# Patient Record
Sex: Male | Born: 1995 | Race: White | Hispanic: No | Marital: Married | Smoking: Never smoker
Health system: Southern US, Community
[De-identification: ages and names within clinical notes are randomized; demographics above are authoritative.]

## PROBLEM LIST (undated history)

## (undated) HISTORY — PX: NO PAST SURGERIES: SHX2092

---

## 2013-01-24 ENCOUNTER — Ambulatory Visit (INDEPENDENT_AMBULATORY_CARE_PROVIDER_SITE_OTHER): Payer: BC Managed Care – PPO | Admitting: Family Medicine

## 2013-01-24 ENCOUNTER — Encounter: Payer: Self-pay | Admitting: Family Medicine

## 2013-01-24 VITALS — BP 110/72 | HR 60 | Temp 99.0°F | Resp 18 | Ht 73.0 in | Wt 226.0 lb

## 2013-01-24 DIAGNOSIS — Z23 Encounter for immunization: Secondary | ICD-10-CM

## 2013-01-24 DIAGNOSIS — M545 Low back pain: Secondary | ICD-10-CM

## 2013-01-24 MED ORDER — CYCLOBENZAPRINE HCL 10 MG PO TABS: 10.0000 mg | ORAL_TABLET | Freq: Three times a day (TID) | ORAL | Status: DC | PRN

## 2013-01-24 MED ORDER — MELOXICAM 15 MG PO TABS
15.0000 mg | ORAL_TABLET | Freq: Every day | ORAL | Status: DC
Start: 2013-01-24 — End: 2014-04-03

## 2013-01-24 NOTE — Progress Notes (Signed)
Subjective:    Patient ID: Hector Gray, male    DOB: Jul 23, 1995, 17 y.o.   MRN: [id number]  HPI Patient reports one week of knee at the lower back pain on the left side. He denies any injury to his back. He states it started with a mild pain in his left side slightly below his shoulder blade. It has gradually worsened to become a moderate constant pain that is worse with movement. He denies any pleurisy. He denies any cough. He denies any hemoptysis. He denies any fever. He denies any shortness of breath. He denies any abdominal pain. He denies any nausea vomiting. He denies any hematuria or dysuria. The pain does not radiate and is made worse by movement. He denies any symptoms of cauda equina syndrome or sciatica. He has no bony tenderness in the center of his back. He does have a palpable muscle spasm in his lower back on the left side. Pain is worsened with prolonged standing or working.  It is better with ibuprofen. No past medical history on file. No current outpatient prescriptions on file prior to visit.   No current facility-administered medications on file prior to visit.   No Known Allergies History   Social History  . Marital Status: Single    Spouse Name: N/A    Number of Children: N/A  . Years of Education: N/A   Occupational History  . Not on file.   Social History Main Topics  . Smoking status: Never Smoker   . Smokeless tobacco: Not on file  . Alcohol Use: No  . Drug Use: No  . Sexual Activity: Not on file   Other Topics Concern  . Not on file   Social History Narrative  . No narrative on file      Review of Systems  All other systems reviewed and are negative.       Objective:   Physical Exam  Vitals reviewed. Constitutional: He is oriented to person, place, and time.  Cardiovascular: Normal rate, regular rhythm and normal heart sounds.   No murmur heard. Pulmonary/Chest: Effort normal and breath sounds normal. No respiratory distress. He has  no wheezes. He has no rales.  Abdominal: Soft. Bowel sounds are normal. He exhibits no distension. There is no tenderness. There is no rebound.  Musculoskeletal:       Thoracic back: He exhibits pain. He exhibits normal range of motion, no tenderness, no bony tenderness and no spasm.       Lumbar back: He exhibits pain and spasm. He exhibits normal range of motion, no tenderness and no bony tenderness.  Neurological: He is alert and oriented to person, place, and time. He has normal reflexes. He displays normal reflexes. No cranial nerve deficit. He exhibits normal muscle tone. Coordination normal.   patient has a negative straight leg raise bilaterally. He has normal reflexes at the knee and ankle. He has normal muscle strength in the legs.        Assessment & Plan:  1. Low back pain Begin Mobic 15 mg by mouth daily. Use Flexeril 10 mg every 8 hours as needed for muscle spasms. I recommended he use the Flexeril only at night ago sleep. I recommended compresses to the area twice a day. If symptoms are not improving in one week I would recommend a T-spine x-ray as well as an L-spine x-ray. Obviously if symptoms change he is to notify me immediately particularly if he develops any pain with urination or hematuria or the  pain begins to radiate. - meloxicam (MOBIC) 15 MG tablet; Take 1 tablet (15 mg total) by mouth daily.  Dispense: 30 tablet; Refill: 0 - cyclobenzaprine (FLEXERIL) 10 MG tablet; Take 1 tablet (10 mg total) by mouth 3 (three) times daily as needed for muscle spasms.  Dispense: 30 tablet; Refill: 0

## 2013-01-27 ENCOUNTER — Other Ambulatory Visit: Payer: Self-pay | Admitting: Family Medicine

## 2013-01-27 ENCOUNTER — Telehealth: Payer: Self-pay | Admitting: Family Medicine

## 2013-01-27 ENCOUNTER — Ambulatory Visit
Admission: RE | Admit: 2013-01-27 | Discharge: 2013-01-27 | Disposition: A | Discharge status: Home / Self Care | Payer: BC Managed Care – PPO | Source: Ambulatory Visit | Attending: Family Medicine | Admitting: Family Medicine

## 2013-01-27 DIAGNOSIS — M549 Dorsalgia, unspecified: Secondary | ICD-10-CM

## 2013-01-27 MED ORDER — TRAMADOL HCL 50 MG PO TABS: 50.0000 mg | ORAL_TABLET | Freq: Four times a day (QID) | ORAL | Status: DC | PRN

## 2013-01-27 NOTE — Telephone Encounter (Signed)
I gave him mobic 1 a day, and flexeril as needed.  Do not take mobic and motrin together.  Can use tylenol.  Go get x-ray and I will order.

## 2013-01-27 NOTE — Telephone Encounter (Signed)
Patient is still hurting in his back . Cyclobenzaprine  Is it all right to take tylenol or IBU  with this medicine in between doses ?

## 2013-01-27 NOTE — Telephone Encounter (Signed)
Pts mother aware and will take him to get xray

## 2013-01-27 NOTE — Telephone Encounter (Signed)
Pt is in a lot of pain and laying in the floor with heating pad on back. Is there anything stronger he could take?  Per [facility] Can take ultram 50mg  q 6 hours and Flexeril. Continue Mobic. - Pts mother aware and med called to pharm.

## 2013-01-31 ENCOUNTER — Ambulatory Visit: Payer: BC Managed Care – PPO | Admitting: Family Medicine

## 2013-04-07 ENCOUNTER — Ambulatory Visit: Payer: BC Managed Care – PPO | Admitting: Family Medicine

## 2014-04-03 ENCOUNTER — Encounter: Payer: Self-pay | Admitting: Family Medicine

## 2014-04-03 ENCOUNTER — Ambulatory Visit (INDEPENDENT_AMBULATORY_CARE_PROVIDER_SITE_OTHER): Payer: BC Managed Care – PPO | Admitting: Family Medicine

## 2014-04-03 VITALS — BP 128/80 | HR 68 | Temp 98.0°F | Resp 18 | Wt 212.0 lb

## 2014-04-03 DIAGNOSIS — R3915 Urgency of urination: Secondary | ICD-10-CM

## 2014-04-03 DIAGNOSIS — N342 Other urethritis: Secondary | ICD-10-CM

## 2014-04-03 LAB — URINALYSIS, ROUTINE W REFLEX MICROSCOPIC
BILIRUBIN URINE: NEGATIVE
Glucose, UA: NEGATIVE mg/dL
Hgb urine dipstick: NEGATIVE
Ketones, ur: NEGATIVE mg/dL
Leukocytes, UA: NEGATIVE
Nitrite: NEGATIVE
PH: 6.5 (ref 5.0–8.0)
Protein, ur: NEGATIVE mg/dL
SPECIFIC GRAVITY, URINE: 1.015 (ref 1.005–1.030)
UROBILINOGEN UA: 0.2 mg/dL (ref 0.0–1.0)

## 2014-04-03 MED ORDER — CIPROFLOXACIN HCL 500 MG PO TABS: 500.0000 mg | ORAL_TABLET | Freq: Two times a day (BID) | ORAL | Status: DC

## 2014-04-03 NOTE — Addendum Note (Signed)
Addended by: Calvyn Kurtzman M on: 04/03/2014 10:39 AM   Modules accepted: Orders  

## 2014-04-03 NOTE — Progress Notes (Signed)
   Subjective:    Patient ID: Hector Gray, male    DOB: 1995-07-24, 18 y.o.   MRN: [id number]  HPI Patient is a 24 hours of urinary urgency and frequency. He states that he constantly feels like he has to go the restroom. When he goes to the restroom he will only a trace amount. As soon as he begins to stand up, he feels like he has to urinate again. He has some mild dysuria. He denies any hematuria. He denies any exposure to gonorrhea or chlamydia. Urinalysis today is completely normal. There is no evidence of a penile rash or herpes. No past medical history on file. No past surgical history on file. No current outpatient prescriptions on file prior to visit.   No current facility-administered medications on file prior to visit.   No Known Allergies History   Social History  . Marital Status: Single    Spouse Name: N/A    Number of Children: N/A  . Years of Education: N/A   Occupational History  . Not on file.   Social History Main Topics  . Smoking status: Never Smoker   . Smokeless tobacco: Not on file  . Alcohol Use: No  . Drug Use: No  . Sexual Activity: Not on file   Other Topics Concern  . Not on file   Social History Narrative      Review of Systems  All other systems reviewed and are negative.      Objective:   Physical Exam  Cardiovascular: Normal rate and regular rhythm.   Pulmonary/Chest: Effort normal and breath sounds normal.  Genitourinary: Penis normal.  Vitals reviewed.         Assessment & Plan:  Urgency of urination - Plan: Urinalysis, Routine w reflex microscopic  Urethritis - Plan: ciprofloxacin (CIPRO) 500 MG tablet  Patient symptoms sound like urethritis. There may be a mild bacterial infection in the urethra causing his symptoms. I will screen the patient for gonorrhea and chlamydia. I will treat the patient empirically with Cipro 500 mg by mouth twice a day and await the results of his urine culture as well as GC chlamydia  screen

## 2014-04-04 LAB — URINE CULTURE
Colony Count: NO GROWTH
Organism ID, Bacteria: NO GROWTH

## 2014-04-04 LAB — GC/CHLAMYDIA PROBE AMP, URINE
CHLAMYDIA, SWAB/URINE, PCR: NEGATIVE
GC PROBE AMP, URINE: NEGATIVE

## 2014-07-27 ENCOUNTER — Ambulatory Visit (INDEPENDENT_AMBULATORY_CARE_PROVIDER_SITE_OTHER): Payer: BLUE CROSS/BLUE SHIELD | Admitting: Family Medicine

## 2014-07-27 ENCOUNTER — Encounter: Payer: Self-pay | Admitting: Family Medicine

## 2014-07-27 VITALS — BP 118/60 | HR 64 | Temp 98.5°F | Resp 12 | Ht 73.0 in | Wt 208.0 lb

## 2014-07-27 DIAGNOSIS — J0100 Acute maxillary sinusitis, unspecified: Secondary | ICD-10-CM

## 2014-07-27 MED ORDER — AMOXICILLIN 875 MG PO TABS: 875.0000 mg | ORAL_TABLET | Freq: Two times a day (BID) | ORAL | Status: DC

## 2014-07-27 NOTE — Progress Notes (Signed)
Patient ID: Hector Gray, male   DOB: 12-Oct-1995, 19 y.o.   MRN: [id number]   Subjective:    Patient ID: Hector Galloddison Packard, male    DOB: 12-Oct-1995, 19 y.o.   MRN: [id number]  Patient presents for Illness  patient here with sinus pressure and drainage worsening of the past 3 weeks. He states he does get some mild allergies but usually that clears up within a couple days with the season changes. He's had headache and low-grade fever for the past couple days as well. He has not had any cough no body aches otherwise. He has taken some over-the-counter cough and sinus medicine with minimal improvement    Review Of Systems:  GEN- denies fatigue, fever, weight loss,weakness, recent illness HEENT- denies eye drainage, change in vision, +nasal discharge, CVS- denies chest pain, palpitations RESP- denies SOB, cough, wheeze ABD- denies N/V, change in stools, abd pain GU- denies dysuria, hematuria, dribbling, incontinence MSK- denies joint pain, muscle aches, injury Neuro- + headache, dizziness, syncope, seizure activity       Objective:    BP 118/60 mmHg  Pulse 64  Temp(Src) 98.5 F (36.9 C) (Oral)  Resp 12  Ht 6\' 1"  (1.854 m)  Wt 208 lb (94.348 kg)  BMI 27.45 kg/m2 GEN- NAD, alert and oriented x3 HEENT- PERRL, EOMI, non injected sclera, pink conjunctiva, MMM, oropharynx mild injection, TM clear bilat no effusion,  + maxillary sinus tenderness, inflammed turbinates,  +Nasal drainage  Neck- Supple, shotty ant  LAD CVS- RRR, no murmur RESP-CTAB EXT- No edema Pulses- Radial 2+         Assessment & Plan:      Problem List Items Addressed This Visit    None    Visit Diagnoses    Acute maxillary sinusitis, recurrence not specified    -  Primary    Amox x 10 days, sudafed, nasal saline    Relevant Medications    amoxicillin (AMOXIL) tablet       Note: This dictation was prepared with Dragon dictation along with smaller phrase technology. Any transcriptional errors that  result from this process are unintentional.

## 2014-07-27 NOTE — Patient Instructions (Signed)
Take antibiotics as prescribed Use nasal saline Sudafed  F/U as needed

## 2014-09-25 IMAGING — CR DG LUMBAR SPINE COMPLETE 4+V
5 series · 5 of 5 positions shown · non-contrast
Comparison: Thoracic series from the same day reported separately.

CLINICAL DATA: 16-year-old with pain. No known injury.

EXAM:
LUMBAR SPINE - COMPLETE 4+ VIEW

[t l-spine a.p.]
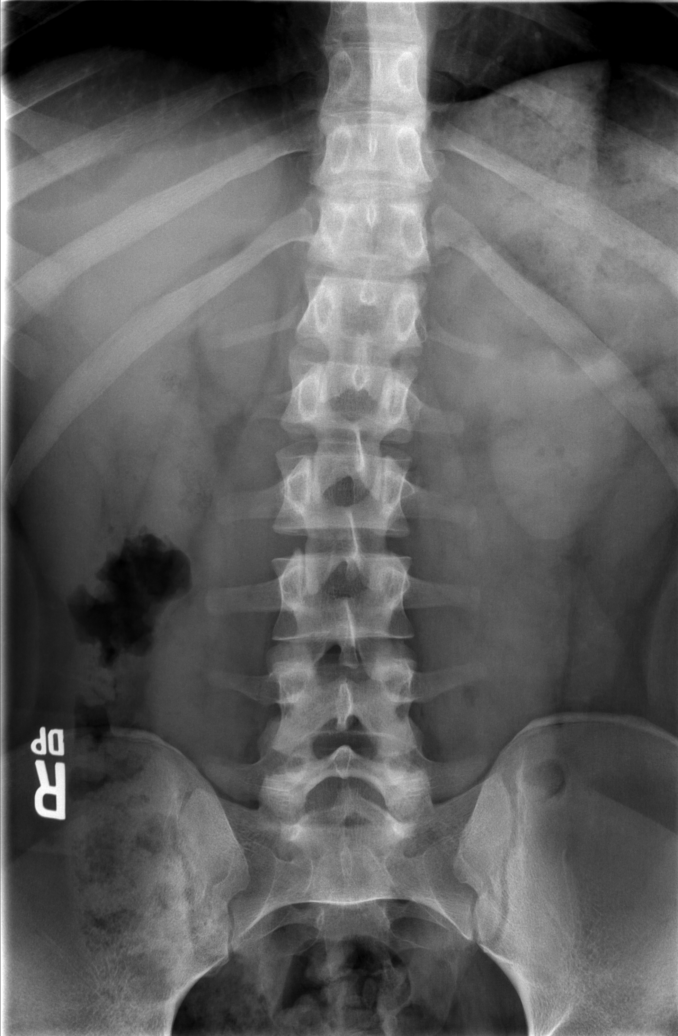

[t l-spine oblique exposure (1 of 2)]
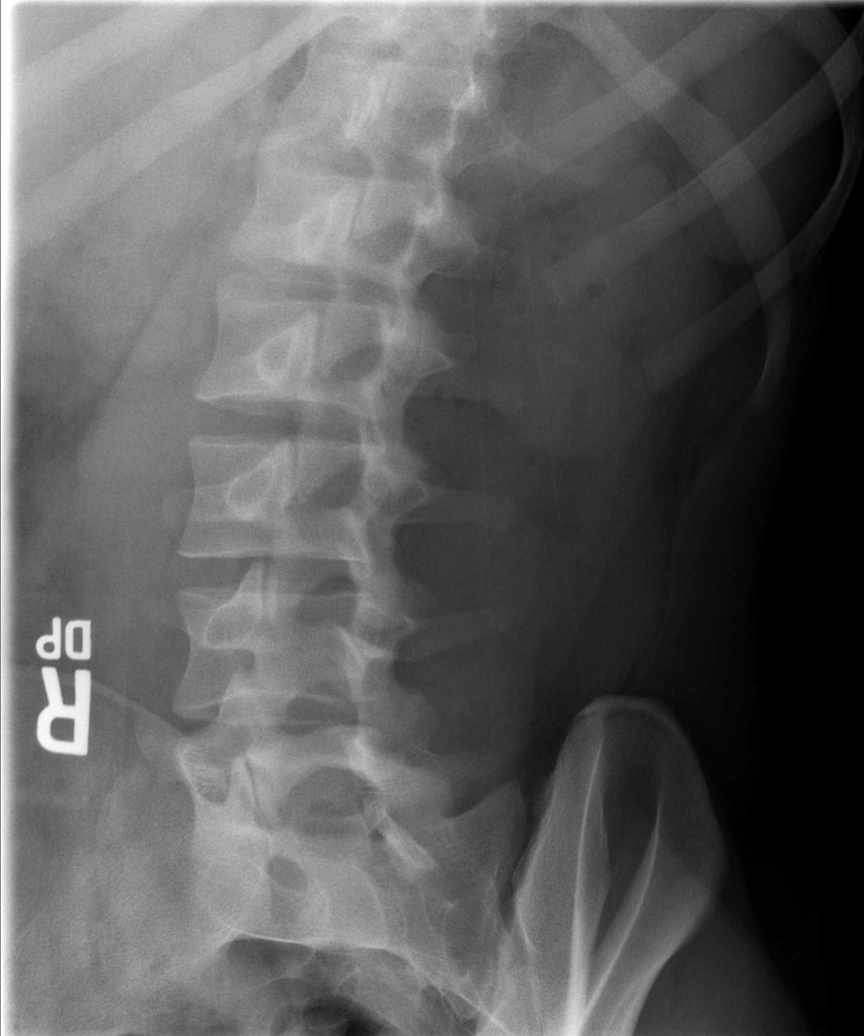

[t l-spine oblique exposure (2 of 2)]
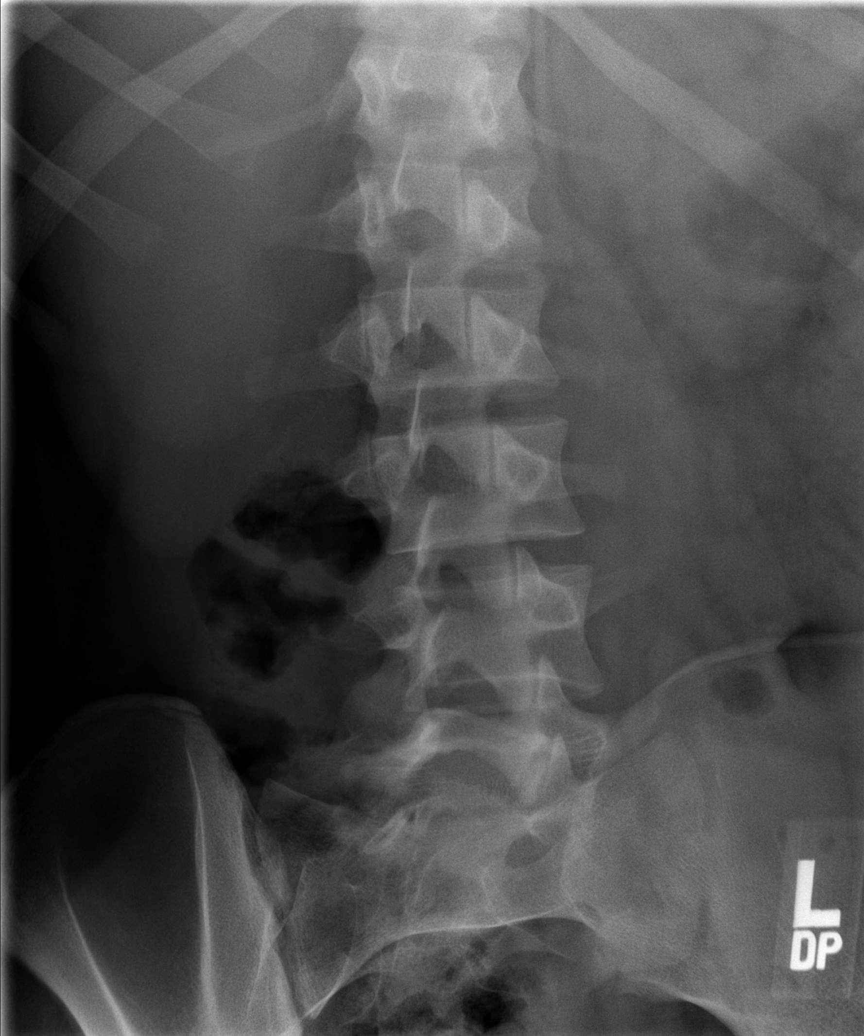

[t l-spine lat]
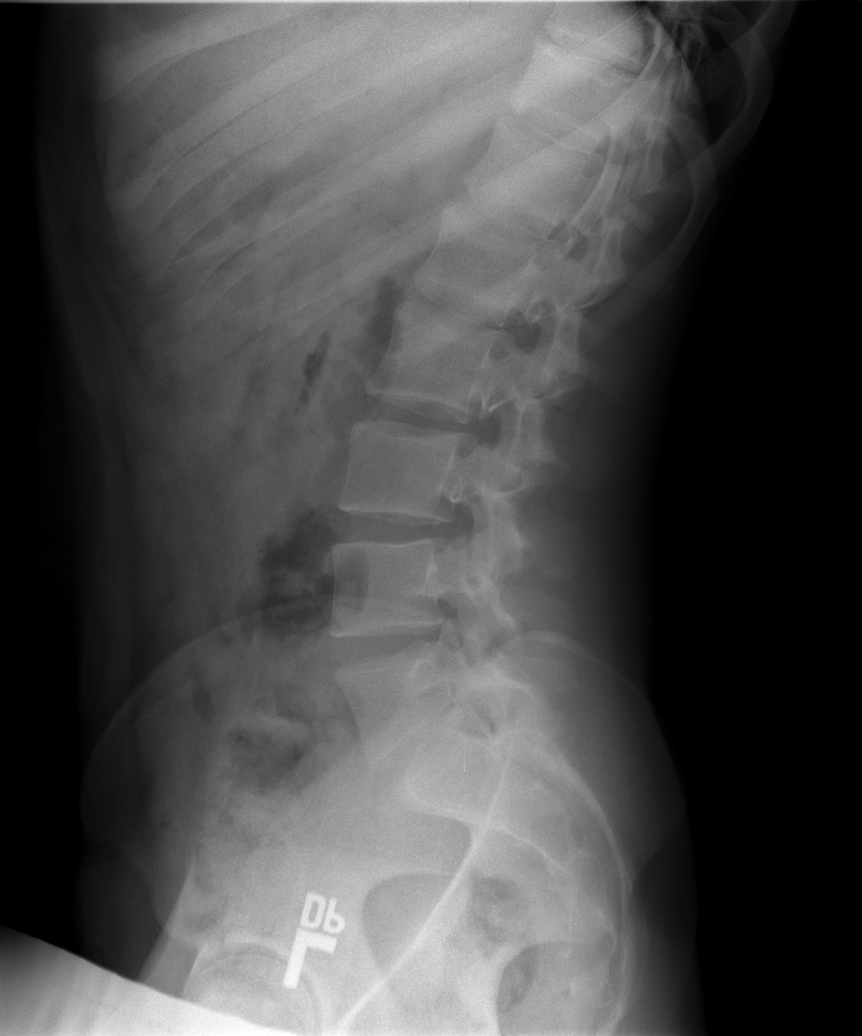

[t l-spine l5-s1 spot]
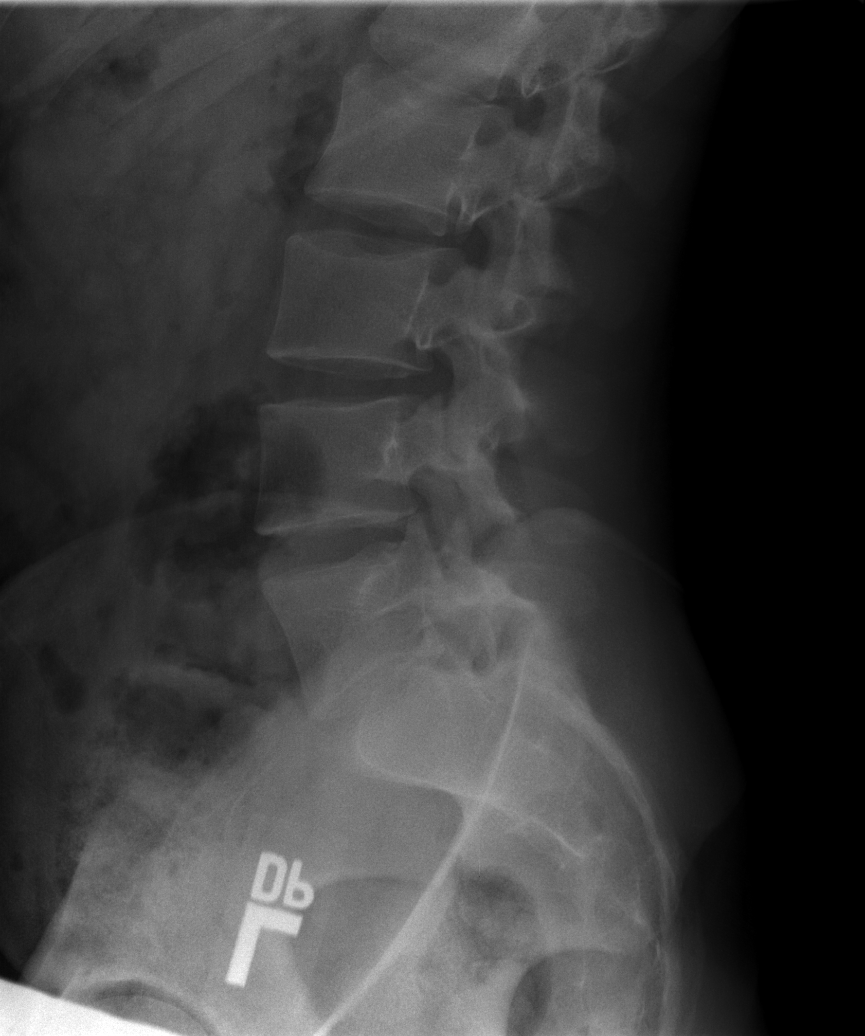

[5 of 5 positions shown; findings below may reference images not displayed]

FINDINGS: The patient is nearing skeletal maturity. Normal lumbar
segmentation. Subtle lower thoracic spinal curvature again noted.
Lumbar vertebral height and alignment within normal limits. Normal
disc spaces. No pars fracture. sacral ala and SI joints within
normal limits.
IMPRESSION: Negative radiographic appearance of the lumbar spine.

## 2015-08-29 ENCOUNTER — Encounter: Payer: Self-pay | Admitting: Family Medicine

## 2015-08-29 ENCOUNTER — Ambulatory Visit (INDEPENDENT_AMBULATORY_CARE_PROVIDER_SITE_OTHER): Payer: BLUE CROSS/BLUE SHIELD | Admitting: Family Medicine

## 2015-08-29 VITALS — BP 126/80 | HR 56 | Temp 98.8°F | Resp 12 | Ht 73.0 in | Wt 220.0 lb

## 2015-08-29 DIAGNOSIS — H6981 Other specified disorders of Eustachian tube, right ear: Secondary | ICD-10-CM | POA: Diagnosis not present

## 2015-08-29 MED ORDER — FLUTICASONE PROPIONATE 50 MCG/ACT NA SUSP: 2.0000 | Freq: Every day | NASAL | Status: DC

## 2015-08-29 MED ORDER — CETIRIZINE HCL 10 MG PO TABS: 10.0000 mg | ORAL_TABLET | Freq: Every day | ORAL | Status: DC

## 2015-08-29 NOTE — Progress Notes (Signed)
   Subjective:    Patient ID: Hector Gray, male    DOB: [birth year], 20 y.o.   MRN: [id number]  HPI  He reports a one-week history of right ear being stopped up. Has been battling allergies recently. Patient states he feels like his ear is full of water. He also reports a dull pressure. He reports decreased hearing and some mild tinnitus. On examination today, the right tympanic membrane shows a clear serous effusion. There are some mild erythema. There is no obstruction of the external auditory canal. He has prominently swollen nasal mucosa and turbinates. He is extremely congested with rhinorrhea. He denies any fevers or chills or sinus pain No past medical history on file. No past surgical history on file. No current outpatient prescriptions on file prior to visit.   No current facility-administered medications on file prior to visit.   No Known Allergies Social History   Social History  . Marital Status: Single    Spouse Name: N/A  . Number of Children: N/A  . Years of Education: N/A   Occupational History  . Not on file.   Social History Main Topics  . Smoking status: Never Smoker   . Smokeless tobacco: Not on file  . Alcohol Use: No  . Drug Use: No  . Sexual Activity: Not on file   Other Topics Concern  . Not on file   Social History Narrative     Review of Systems  All other systems reviewed and are negative.      Objective:   Physical Exam  Constitutional: He appears well-developed and well-nourished. No distress.  HENT:  Right Ear: No tenderness. Tympanic membrane is injected and bulging. Tympanic membrane is not scarred, not perforated, not erythematous and not retracted. Tympanic membrane mobility is abnormal. A middle ear effusion is present.  Left Ear: External ear normal. No tenderness. Tympanic membrane is not injected, not scarred, not perforated, not erythematous and not retracted.  No middle ear effusion.  Mouth/Throat: Oropharynx is clear and  moist. No oropharyngeal exudate.  Skin: He is not diaphoretic.  Vitals reviewed.         Assessment & Plan:  Eustachian tube dysfunction, right - Plan: cetirizine (ZYRTEC) 10 MG tablet, fluticasone (FLONASE) 50 MCG/ACT nasal spray  Appears to have eustachian tube dysfunction. Begin Flonase 2 sprays each nostril daily. Begin Zyrtec 10 mg by mouth daily daily. Begin home exercises to help open the eustachian tube.  Can also use Sudafed as needed.

## 2017-07-02 ENCOUNTER — Ambulatory Visit: Payer: BLUE CROSS/BLUE SHIELD | Admitting: Family Medicine

## 2021-09-19 ENCOUNTER — Ambulatory Visit (INDEPENDENT_AMBULATORY_CARE_PROVIDER_SITE_OTHER): Payer: No Typology Code available for payment source | Admitting: Nurse Practitioner

## 2021-09-19 ENCOUNTER — Encounter: Payer: Self-pay | Admitting: Nurse Practitioner

## 2021-09-19 VITALS — BP 120/70 | HR 69 | Temp 97.7°F | Resp 14 | Ht 73.5 in | Wt 232.1 lb

## 2021-09-19 DIAGNOSIS — Z23 Encounter for immunization: Secondary | ICD-10-CM

## 2021-09-19 DIAGNOSIS — Z6830 Body mass index (BMI) 30.0-30.9, adult: Secondary | ICD-10-CM

## 2021-09-19 DIAGNOSIS — E6609 Other obesity due to excess calories: Secondary | ICD-10-CM

## 2021-09-19 DIAGNOSIS — Z0000 Encounter for general adult medical examination without abnormal findings: Secondary | ICD-10-CM | POA: Diagnosis not present

## 2021-09-19 DIAGNOSIS — N5089 Other specified disorders of the male genital organs: Secondary | ICD-10-CM

## 2021-09-19 LAB — COMPREHENSIVE METABOLIC PANEL
ALT: 31 U/L (ref 0–53)
AST: 19 U/L (ref 0–37)
Albumin: 4.6 g/dL (ref 3.5–5.2)
Alkaline Phosphatase: 55 U/L (ref 39–117)
BUN: 17 mg/dL (ref 6–23)
CO2: 29 mEq/L (ref 19–32)
Calcium: 10 mg/dL (ref 8.4–10.5)
Chloride: 103 mEq/L (ref 96–112)
Creatinine, Ser: 0.99 mg/dL (ref 0.40–1.50)
GFR: 104.46 mL/min (ref >60.00)
Glucose, Bld: 94 mg/dL (ref 70–99)
Potassium: 4.3 mEq/L (ref 3.5–5.1)
Sodium: 139 mEq/L (ref 135–145)
Total Bilirubin: 0.7 mg/dL (ref 0.2–1.2)
Total Protein: 7.3 g/dL (ref 6.0–8.3)

## 2021-09-19 LAB — CBC
HCT: 44.8 % (ref 39.0–52.0)
Hemoglobin: 15 g/dL (ref 13.0–17.0)
MCHC: 33.4 g/dL (ref 30.0–36.0)
MCV: 86.5 fl (ref 78.0–100.0)
Platelets: 255 10*3/uL (ref 150.0–400.0)
RBC: 5.18 Mil/uL (ref 4.22–5.81)
RDW: 13.4 % (ref 11.5–15.5)
WBC: 6.6 10*3/uL (ref 4.0–10.5)

## 2021-09-19 LAB — HEMOGLOBIN A1C: Hgb A1c MFr Bld: 5.7 % (ref 4.6–6.5)

## 2021-09-19 LAB — LIPID PANEL
Cholesterol: 249 mg/dL — ABNORMAL HIGH (ref 0–200)
HDL: 47.2 mg/dL (ref >39.00)
LDL Cholesterol: 185 mg/dL — ABNORMAL HIGH (ref 0–99)
NonHDL: 202.09
Total CHOL/HDL Ratio: 5
Triglycerides: 87 mg/dL (ref 0.0–149.0)
VLDL: 17.4 mg/dL (ref 0.0–40.0)

## 2021-09-19 LAB — TSH: TSH: 2.58 u[IU]/mL (ref 0.35–5.50)

## 2021-09-19 NOTE — Assessment & Plan Note (Signed)
Incidental finding on exam.  Likely cyst, hydrocele, varicocele.  No pain on palpation.  Did recommend patient get an ultrasound of scrotum to make sure since it is close proximity with the testicle.  Do not think it is actually on the testicle did discuss this with patient.  Ultrasound scrotum ordered and information given to patient and told to call to schedule to get exam completed.

## 2021-09-19 NOTE — Patient Instructions (Signed)
Nice to see you today ?I will be in touch with the lab results once I have them ?Follow up with me in 1 year, sooner if you need me ? ?

## 2021-09-19 NOTE — Progress Notes (Signed)
Established Patient Office Visit  Subjective   Patient ID: Hector Gray, male    DOB: 1995/10/09  Age: 26 y.o. MRN: [id number]  Chief Complaint  Patient presents with   Establish Care    Previous PCP Dr Tanya Nones at [facility]    HPI for complete physical and follow up of chronic conditions.  Immunizations: -Tetanus: update today -Influenza: Out of season -Covid-19: Refused -Shingles: Too young -Pneumonia: Too young  -HPV: Up-to-date  Diet: Fair diet. 1 meal a day no snack. Tea, soda , water Exercise: No regular exercise. Labiorus jo  Eye exam:  PRN Dental exam: Completes semi-annually   Colonoscopy: Too young Lung Cancer Screening: N/A Dexa: N/A  PSA: Too young  Sleep: 10-11 and 6-730am. Feels rested. Not a snorer     Review of Systems  Constitutional:  Negative for chills, fever and malaise/fatigue.  Respiratory:  Negative for cough and shortness of breath.   Cardiovascular:  Negative for chest pain and leg swelling.  Gastrointestinal:  Negative for diarrhea, nausea and vomiting.       BM daily   Genitourinary:  Negative for dysuria and hematuria.       Nocturia negative  Neurological:  Negative for dizziness, tingling, weakness and headaches.  Psychiatric/Behavioral:  Negative for hallucinations and suicidal ideas.      Objective:     BP 120/70   Pulse 69   Temp 97.7 F (36.5 C)   Resp 14   Ht 6' 1.5" (1.867 m)   Wt 232 lb 2 oz (105.3 kg)   SpO2 98%   BMI 30.21 kg/m    Physical Exam Vitals and nursing note reviewed. Exam conducted with a chaperone present [facility] [place], RMA).  Constitutional:      Appearance: Normal appearance.  HENT:     Right Ear: Tympanic membrane, ear canal and external ear normal.     Left Ear: Tympanic membrane, ear canal and external ear normal.     Mouth/Throat:     Mouth: Mucous membranes are moist.     Pharynx: Oropharynx is clear.  Eyes:     Extraocular Movements: Extraocular movements intact.      Pupils: Pupils are equal, round, and reactive to light.  Cardiovascular:     Rate and Rhythm: Normal rate and regular rhythm.     Pulses: Normal pulses.     Heart sounds: Normal heart sounds.  Pulmonary:     Effort: Pulmonary effort is normal.     Breath sounds: Normal breath sounds.  Abdominal:     General: Bowel sounds are normal. There is no distension.     Palpations: There is no mass.     Tenderness: There is no abdominal tenderness.     Hernia: No hernia is present. There is no hernia in the left inguinal area or right inguinal area.  Genitourinary:    Penis: Normal.      Testes:        Right: Mass present.     Epididymis:     Right: Normal.     Left: Normal.    Musculoskeletal:     Right lower leg: No edema.     Left lower leg: No edema.  Lymphadenopathy:     Lower Body: No right inguinal adenopathy. No left inguinal adenopathy.  Skin:    General: Skin is warm.  Neurological:     General: No focal deficit present.     Mental Status: He is alert.     Deep Tendon  Reflexes:     Reflex Scores:      Bicep reflexes are 2+ on the right side and 2+ on the left side.      Patellar reflexes are 2+ on the right side and 2+ on the left side.    Comments: Bilateral upper and lower extremity strength 5/5  Psychiatric:        Mood and Affect: Mood normal.        Behavior: Behavior normal.        Thought Content: Thought content normal.        Judgment: Judgment normal.     No results found for any visits on 09/19/21.    The ASCVD Risk score (Arnett DK, et al., 2019) failed to calculate for the following reasons:   The 2019 ASCVD risk score is only valid for ages 43 to 63    Assessment & Plan:   Problem List Items Addressed This Visit       Other   Testicular mass    Incidental finding on exam.  Likely cyst, hydrocele, varicocele.  No pain on palpation.  Did recommend patient get an ultrasound of scrotum to make sure since it is close proximity with the testicle.   Do not think it is actually on the testicle did discuss this with patient.  Ultrasound scrotum ordered and information given to patient and told to call to schedule to get exam completed.       Relevant Orders   US SCROTUM W/DOPPLER   Class 1 obesity due to excess calories without serious comorbidity with body mass index (BMI) of 30.0 to 30.9 in adult    Continue working on lifestyle modifications patient does have a very laborious job       Relevant Orders   Hemoglobin A1c   Lipid panel   Preventative health care - Primary    Discussed age-appropriate immunizations and screening exams.       Relevant Orders   CBC   Comprehensive metabolic panel   Hemoglobin A1c   TSH   Lipid panel   Other Visit Diagnoses     Need for Td vaccine       Relevant Orders   Td : Tetanus/diphtheria >7yo Preservative  free (Completed)       Return in about 1 year (around 09/20/2022) for CPE and labs.    Audria Nine, NP

## 2021-09-19 NOTE — Assessment & Plan Note (Signed)
Discussed age-appropriate immunizations and screening exams. 

## 2021-09-19 NOTE — Assessment & Plan Note (Signed)
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

## 2021-09-23 ENCOUNTER — Other Ambulatory Visit: Payer: Self-pay | Admitting: Nurse Practitioner

## 2021-09-23 DIAGNOSIS — E7800 Pure hypercholesterolemia, unspecified: Secondary | ICD-10-CM

## 2022-03-26 ENCOUNTER — Other Ambulatory Visit: Payer: Self-pay

## 2022-03-27 ENCOUNTER — Telehealth: Payer: Self-pay | Admitting: Nurse Practitioner

## 2022-03-27 NOTE — Telephone Encounter (Signed)
Patient wife called and asked why patient has a 65-month fasting labs on 04/01/2022 and requested a call back.

## 2022-03-27 NOTE — Telephone Encounter (Signed)
Patients wife was advised per result note in 09/2021 that he was to repeat his lipid panel fasting to see if his cholesterol has decreased. She states patient does not want to come in for this lab appointment. Advised of th importance considering family history that he get his cholesterol at goal. He prefers to repeat labs at his annual physical.  Canceled lab appointment for 04/01/22.

## 2022-04-01 ENCOUNTER — Other Ambulatory Visit: Payer: Self-pay

## 2022-10-05 ENCOUNTER — Ambulatory Visit (INDEPENDENT_AMBULATORY_CARE_PROVIDER_SITE_OTHER): Payer: 59 | Admitting: Family Medicine

## 2022-10-05 ENCOUNTER — Encounter: Payer: Self-pay | Admitting: Family Medicine

## 2022-10-05 VITALS — BP 116/80 | HR 63 | Temp 98.0°F | Ht 73.5 in | Wt 253.5 lb

## 2022-10-05 DIAGNOSIS — H9201 Otalgia, right ear: Secondary | ICD-10-CM | POA: Diagnosis not present

## 2022-10-05 NOTE — Progress Notes (Signed)
    [name], MD, CAQ Sports Medicine [facility] [address] [place], [address]  Phone: [phone]  FAX: [phone]  Hector Gray - 27 y.o. male  MRN [id number]  Date of Birth: [birth year]  Date: 10/05/2022  PCP: Eden Emms, NP  Referral: Eden Emms, NP  Chief Complaint  Patient presents with   Ear Pain    Right-?Swimmers Ear   Subjective:   Hector Gray is a 27 y.o. very pleasant male patient with Body mass index is 32.99 kg/m. who presents with the following:  Jumped off of a cliff - R ear was hurting and running after then   Basically, over the weekend the patient was running and jumped off a high [place] into a [place].  At that time he developed some pain in the ear after impact, and he also has had a little bit of some wetness in the ear canal and dripping after this.  He denies any other symptoms including headache, sore throat, sinus pain, coughing, nasal congestion, or any other ongoing acute symptoms.  Review of Systems is noted in the HPI, as appropriate  Objective:   BP 116/80 (BP Location: Right Arm, Patient Position: Sitting, Cuff Size: Large)   Pulse 63   Temp 98 F (36.7 C) (Temporal)   Ht 6' 1.5" (1.867 m)   Wt 253 lb 8 oz (115 kg)   SpO2 98%   BMI 32.99 kg/m   GEN: No acute distress; alert,appropriate. PULM: Breathing comfortably in no respiratory distress PSYCH: Normally interactive.  Your canals on both the ears are not inflamed in appearance, he has no tenderness with moving the entirety of the ear. Tympanic membranes are visualized easily on both sides without any evidence of swelling or redness. I do not appreciate a defect in the tympanic membrane on the right  Laboratory and Imaging Data:  Assessment and Plan:     ICD-10-CM   1. Right ear pain  H92.01      Anticipate traumatic injury to the ear, this will improve with time.  Suspect he may have a small microtear in the TM,  as well.  Time alone should resolve this.  Disposition: No follow-ups on file.  [employer] speech-to-text software was used for transcription in this dictation.  Possible transcriptional errors can occur using Animal nutritionist.   Signed,  Elpidio Galea. Gennie Dib, MD   No outpatient encounter medications on file as of 10/05/2022.   No facility-administered encounter medications on file as of 10/05/2022.

## 2022-11-27 ENCOUNTER — Ambulatory Visit (INDEPENDENT_AMBULATORY_CARE_PROVIDER_SITE_OTHER): Payer: 59 | Admitting: Nurse Practitioner

## 2022-11-27 ENCOUNTER — Encounter: Payer: Self-pay | Admitting: Nurse Practitioner

## 2022-11-27 VITALS — BP 120/88 | HR 71 | Temp 98.7°F | Ht 74.0 in | Wt 244.4 lb

## 2022-11-27 DIAGNOSIS — E669 Obesity, unspecified: Secondary | ICD-10-CM

## 2022-11-27 DIAGNOSIS — E7800 Pure hypercholesterolemia, unspecified: Secondary | ICD-10-CM | POA: Insufficient documentation

## 2022-11-27 DIAGNOSIS — Z0000 Encounter for general adult medical examination without abnormal findings: Secondary | ICD-10-CM | POA: Diagnosis not present

## 2022-11-27 DIAGNOSIS — N5089 Other specified disorders of the male genital organs: Secondary | ICD-10-CM | POA: Diagnosis not present

## 2022-11-27 DIAGNOSIS — R7303 Prediabetes: Secondary | ICD-10-CM | POA: Insufficient documentation

## 2022-11-27 DIAGNOSIS — Z114 Encounter for screening for human immunodeficiency virus [HIV]: Secondary | ICD-10-CM | POA: Diagnosis not present

## 2022-11-27 DIAGNOSIS — Z1159 Encounter for screening for other viral diseases: Secondary | ICD-10-CM | POA: Diagnosis not present

## 2022-11-27 LAB — CBC
HCT: 45.5 % (ref 39.0–52.0)
Hemoglobin: 15 g/dL (ref 13.0–17.0)
MCHC: 32.9 g/dL (ref 30.0–36.0)
MCV: 87.3 fl (ref 78.0–100.0)
Platelets: 293 10*3/uL (ref 150.0–400.0)
RBC: 5.21 Mil/uL (ref 4.22–5.81)
RDW: 13.1 % (ref 11.5–15.5)
WBC: 6.6 10*3/uL (ref 4.0–10.5)

## 2022-11-27 LAB — COMPREHENSIVE METABOLIC PANEL
ALT: 35 U/L (ref 0–53)
AST: 18 U/L (ref 0–37)
Albumin: 4.5 g/dL (ref 3.5–5.2)
Alkaline Phosphatase: 54 U/L (ref 39–117)
BUN: 12 mg/dL (ref 6–23)
CO2: 28 mEq/L (ref 19–32)
Calcium: 9.9 mg/dL (ref 8.4–10.5)
Chloride: 101 mEq/L (ref 96–112)
Creatinine, Ser: 0.99 mg/dL (ref 0.40–1.50)
GFR: 103.59 mL/min (ref >60.00)
Glucose, Bld: 92 mg/dL (ref 70–99)
Potassium: 4.5 mEq/L (ref 3.5–5.1)
Sodium: 137 mEq/L (ref 135–145)
Total Bilirubin: 0.6 mg/dL (ref 0.2–1.2)
Total Protein: 7 g/dL (ref 6.0–8.3)

## 2022-11-27 LAB — LIPID PANEL
Cholesterol: 268 mg/dL — ABNORMAL HIGH (ref 0–200)
HDL: 39.5 mg/dL (ref >39.00)
LDL Cholesterol: 197 mg/dL — ABNORMAL HIGH (ref 0–99)
NonHDL: 228.95
Total CHOL/HDL Ratio: 7
Triglycerides: 161 mg/dL — ABNORMAL HIGH (ref 0.0–149.0)
VLDL: 32.2 mg/dL (ref 0.0–40.0)

## 2022-11-27 LAB — HEMOGLOBIN A1C: Hgb A1c MFr Bld: 5.7 % (ref 4.6–6.5)

## 2022-11-27 LAB — TSH: TSH: 2.49 u[IU]/mL (ref 0.35–5.50)

## 2022-11-27 NOTE — Patient Instructions (Signed)
Nice to see you today I will be in touch with the labs once I have reviewed them Follow up with me in 1 year for your next physical, sooner if you need me

## 2022-11-27 NOTE — Assessment & Plan Note (Signed)
A1c 5.7 last year pending A1c today

## 2022-11-27 NOTE — Assessment & Plan Note (Signed)
Stable no pain or discomfort noticed on exam again today likely a cyst

## 2022-11-27 NOTE — Assessment & Plan Note (Signed)
Discussed age-appropriate immunizations and screening exams.  Did review patient's personal, surgical, social, family histories.  Patient is up-to-date on all age-appropriate vaccinations that he would like.  Patient is too young for CRC screening or prostate cancer screening.  Patient was given information at discharge about preventative healthcare maintenance with anticipatory guidance.

## 2022-11-27 NOTE — Progress Notes (Signed)
Established Patient Office Visit  Subjective   Patient ID: Hector Gray, male    DOB: 26-Jun-1995  Age: 27 y.o. MRN: [id number]  Chief Complaint  Patient presents with   Annual Exam    Pt has no questions or concerns.     HPI  for complete physical and follow up of chronic conditions.  Immunizations: -Tetanus: Completed in 2023 -Influenza: Out of season -Shingles: Too young -Pneumonia: Too young -HPV: Up-to-date -COVID: Refused  Diet: Fair diet. States with the weather one meal a day. States he willhave coffee water and tea Exercise: No regular exercise. [employer] job  Eye exam: prn Dental exam: Completes semi-annually    Colonoscopy: Too young, currently average risk Lung Cancer Screening: N/A  PSA: Too young, currently average risk  Sleep: states he will go to bed 11-5. Feels rested. Does not snore         Review of Systems  Constitutional:  Negative for chills and fever.  Respiratory:  Negative for shortness of breath.   Cardiovascular:  Negative for chest pain and leg swelling.  Gastrointestinal:  Negative for abdominal pain, blood in stool, constipation, diarrhea, nausea and vomiting.       BM daily  Genitourinary:  Negative for dysuria and hematuria.  Neurological:  Negative for tingling and headaches.  Psychiatric/Behavioral:  Negative for hallucinations and suicidal ideas.       Objective:     BP 120/88   Pulse 71   Temp 98.7 F (37.1 C) (Temporal)   Ht 6\' 2"  (1.88 m)   Wt 244 lb 6.4 oz (110.9 kg)   SpO2 96%   BMI 31.38 kg/m  BP Readings from Last 3 Encounters:  11/27/22 120/88  10/05/22 116/80  09/19/21 120/70   Wt Readings from Last 3 Encounters:  11/27/22 244 lb 6.4 oz (110.9 kg)  10/05/22 253 lb 8 oz (115 kg)  09/19/21 232 lb 2 oz (105.3 kg)      Physical Exam Vitals and nursing note reviewed. Exam conducted with a chaperone present Melina Copa, CMA).  Constitutional:      Appearance: Normal appearance.  HENT:      Right Ear: Tympanic membrane, ear canal and external ear normal.     Left Ear: Tympanic membrane, ear canal and external ear normal.     Mouth/Throat:     Mouth: Mucous membranes are moist.     Pharynx: Oropharynx is clear.  Eyes:     Extraocular Movements: Extraocular movements intact.     Pupils: Pupils are equal, round, and reactive to light.  Cardiovascular:     Rate and Rhythm: Normal rate and regular rhythm.     Pulses: Normal pulses.     Heart sounds: Normal heart sounds.  Pulmonary:     Effort: Pulmonary effort is normal.     Breath sounds: Normal breath sounds.  Abdominal:     General: Bowel sounds are normal. There is no distension.     Palpations: There is no mass.     Tenderness: There is no abdominal tenderness.     Hernia: No hernia is present. There is no hernia in the left inguinal area or right inguinal area.  Genitourinary:    Penis: Normal.      Testes: Normal.     Epididymis:     Right: Normal.     Left: Normal.    Musculoskeletal:     Right lower leg: No edema.     Left lower leg: No edema.  Lymphadenopathy:  Cervical: No cervical adenopathy.     Lower Body: No right inguinal adenopathy. No left inguinal adenopathy.  Skin:    General: Skin is warm.  Neurological:     General: No focal deficit present.     Mental Status: He is alert.     Deep Tendon Reflexes:     Reflex Scores:      Bicep reflexes are 2+ on the right side and 2+ on the left side.      Patellar reflexes are 2+ on the right side and 2+ on the left side.    Comments: Bilateral upper and lower extremity strength 5/5  Psychiatric:        Mood and Affect: Mood normal.        Behavior: Behavior normal.        Thought Content: Thought content normal.        Judgment: Judgment normal.      No results found for any visits on 11/27/22.    The ASCVD Risk score (Arnett DK, et al., 2019) failed to calculate for the following reasons:   The 2019 ASCVD risk score is only valid for ages  32 to 22    Assessment & Plan:   Problem List Items Addressed This Visit       Other   Testicular mass    Stable no pain or discomfort noticed on exam again today likely a cyst      Preventative health care - Primary    Discussed age-appropriate immunizations and screening exams.  Did review patient's personal, surgical, social, family histories.  Patient is up-to-date on all age-appropriate vaccinations that he would like.  Patient is too young for CRC screening or prostate cancer screening.  Patient was given information at discharge about preventative healthcare maintenance with anticipatory guidance.      Relevant Orders   CBC   Comprehensive metabolic panel   TSH   Pure hypercholesterolemia    LDL elevated last year.  Patient has gained weight since last year's physical pending lipid panel today.      Relevant Orders   Hemoglobin A1c   Lipid panel   Prediabetes    A1c 5.7 last year pending A1c today      Relevant Orders   Hemoglobin A1c   Obesity (BMI 30-39.9)    Continue working towards healthy lifestyle modifications.  Pending A1c, TSH, lipid panel today      Relevant Orders   Hemoglobin A1c   Lipid panel   Other Visit Diagnoses     Encounter for hepatitis C screening test for low risk patient       Relevant Orders   Hepatitis C Antibody   Encounter for HIV (human immunodeficiency virus) test       Relevant Orders   HIV antibody (with reflex)       Return in about 1 year (around 11/27/2023) for CPE and Labs.    Audria Nine, NP

## 2022-11-27 NOTE — Assessment & Plan Note (Addendum)
Continue working towards healthy lifestyle modifications.  Pending A1c, TSH, lipid panel today

## 2022-11-27 NOTE — Assessment & Plan Note (Signed)
LDL elevated last year.  Patient has gained weight since last year's physical pending lipid panel today.

## 2023-02-11 ENCOUNTER — Encounter: Payer: 59 | Admitting: Nurse Practitioner

## 2023-06-18 DIAGNOSIS — M7711 Lateral epicondylitis, right elbow: Secondary | ICD-10-CM | POA: Diagnosis not present

## 2023-11-29 ENCOUNTER — Encounter: Payer: 59 | Admitting: Nurse Practitioner

## 2023-11-29 NOTE — Progress Notes (Deleted)
   Established Patient Office Visit  Subjective   Patient ID: Hector Gray, male    DOB: 05/09/1995  Age: 28 y.o. MRN: [id number]  No chief complaint on file.   HPI  for complete physical and follow up of chronic conditions.  Immunizations: -Tetanus: Completed in 2023 -Influenza: Out of season -Shingles: Too young -Pneumonia: TM  Diet: Fair diet.  Exercise: No regular exercise.  Eye exam: Completes annually  Dental exam: Completes semi-annually    Colonoscopy: Too young, currently average risk Lung Cancer Screening: Completed in   PSA: Too young, currently average risk  Sleep:  STI:    {History (Optional):23778}  ROS    Objective:     There were no vitals taken for this visit. {Vitals History (Optional):23777}  Physical Exam   No results found for any visits on 11/29/23.  {Labs (Optional):23779}  The ASCVD Risk score (Arnett DK, et al., 2019) failed to calculate for the following reasons:   The 2019 ASCVD risk score is only valid for ages 73 to 26    Assessment & Plan:   Problem List Items Addressed This Visit   None   No follow-ups on file.    Adina Crandall, NP

## 2023-12-01 ENCOUNTER — Ambulatory Visit: Admitting: Physician Assistant

## 2023-12-01 VITALS — BP 120/79 | HR 63 | Ht 74.0 in | Wt 247.0 lb

## 2023-12-01 DIAGNOSIS — R7303 Prediabetes: Secondary | ICD-10-CM

## 2023-12-01 DIAGNOSIS — E7800 Pure hypercholesterolemia, unspecified: Secondary | ICD-10-CM

## 2023-12-01 DIAGNOSIS — Z0000 Encounter for general adult medical examination without abnormal findings: Secondary | ICD-10-CM | POA: Diagnosis not present

## 2023-12-01 LAB — POCT GLYCOSYLATED HEMOGLOBIN (HGB A1C): HbA1c, POC (prediabetic range): 5.3 % — AB (ref 5.7–6.4)

## 2023-12-01 NOTE — Patient Instructions (Addendum)
 VISIT SUMMARY:  You came in today for a physical exam. Your blood pressure was normal. We discussed your elevated cholesterol levels and prediabetes, and I provided recommendations for managing these conditions.  YOUR PLAN:  -HYPERCHOLESTEROLEMIA: Hypercholesterolemia means you have high levels of cholesterol in your blood, which can increase your risk of heart disease. Your LDL cholesterol is currently at 197 mg/dL, which is above the target level. Given your family history of cardiovascular disease, I recommend scheduling a fasting lipid panel to reassess your cholesterol levels. If your LDL remains elevated, we may consider starting you on a low-dose statin therapy, which is a medication that can help lower cholesterol.  -PREDIABETES: Prediabetes means your blood sugar levels are higher than normal but not high enough to be classified as diabetes. Your A1c level has been stable at 5.7% for the past two years. It's important to monitor your blood sugar levels and avoid high-sugar beverages. We performed a finger prick test for A1c today and will schedule a follow-up lab visit for comprehensive blood work to keep an eye on your condition.   Health Maintenance, Male Adopting a healthy lifestyle and getting preventive care are important in promoting health and wellness. Ask your health care provider about: The right schedule for you to have regular tests and exams. Things you can do on your own to prevent diseases and keep yourself healthy. What should I know about diet, weight, and exercise? Eat a healthy diet  Eat a diet that includes plenty of vegetables, fruits, low-fat dairy products, and lean protein. Do not eat a lot of foods that are high in solid fats, added sugars, or sodium. Maintain a healthy weight Body mass index (BMI) is a measurement that can be used to identify possible weight problems. It estimates body fat based on height and weight. Your health care provider can help determine  your BMI and help you achieve or maintain a healthy weight. Get regular exercise Get regular exercise. This is one of the most important things you can do for your health. Most adults should: Exercise for at least 150 minutes each week. The exercise should increase your heart rate and make you sweat (moderate-intensity exercise). Do strengthening exercises at least twice a week. This is in addition to the moderate-intensity exercise. Spend less time sitting. Even light physical activity can be beneficial. Watch cholesterol and blood lipids Have your blood tested for lipids and cholesterol at 30 years of age, then have this test every 5 years. You may need to have your cholesterol levels checked more often if: Your lipid or cholesterol levels are high. You are older than 30 years of age. You are at high risk for heart disease. What should I know about cancer screening? Many types of cancers can be detected early and may often be prevented. Depending on your health history and family history, you may need to have cancer screening at various ages. This may include screening for: Colorectal cancer. Prostate cancer. Skin cancer. Lung cancer. What should I know about heart disease, diabetes, and high blood pressure? Blood pressure and heart disease High blood pressure causes heart disease and increases the risk of stroke. This is more likely to develop in people who have high blood pressure readings or are overweight. Talk with your health care provider about your target blood pressure readings. Have your blood pressure checked: Every 3-5 years if you are 30-13 years of age. Every year if you are 30 years old or older. If you are between  the ages of 55 and 67 and are a current or former smoker, ask your health care provider if you should have a one-time screening for abdominal aortic aneurysm (AAA). Diabetes Have regular diabetes screenings. This checks your fasting blood sugar level. Have the  screening done: Once every three years after age 30 if you are at a normal weight and have a low risk for diabetes. More often and at a younger age if you are overweight or have a high risk for diabetes. What should I know about preventing infection? Hepatitis B If you have a higher risk for hepatitis B, you should be screened for this virus. Talk with your health care provider to find out if you are at risk for hepatitis B infection. Hepatitis C Blood testing is recommended for: Everyone born from [birth year] through 1967. Anyone with known risk factors for hepatitis C. Sexually transmitted infections (STIs) You should be screened each year for STIs, including gonorrhea and chlamydia, if: You are sexually active and are younger than 30 years of age. You are older than 30 years of age and your health care provider tells you that you are at risk for this type of infection. Your sexual activity has changed since you were last screened, and you are at increased risk for chlamydia or gonorrhea. Ask your health care provider if you are at risk. Ask your health care provider about whether you are at high risk for HIV. Your health care provider may recommend a prescription medicine to help prevent HIV infection. If you choose to take medicine to prevent HIV, you should first get tested for HIV. You should then be tested every 3 months for as long as you are taking the medicine. Follow these instructions at home: Alcohol use Do not drink alcohol if your health care provider tells you not to drink. If you drink alcohol: Limit how much you have to 0-2 drinks a day. Know how much alcohol is in your drink. In the [place] one drink equals one 12 oz bottle of beer (355 mL), one 5 oz glass of wine (148 mL), or one 1 oz glass of hard liquor (44 mL). Lifestyle Do not use any products that contain nicotine or tobacco. These products include cigarettes, chewing tobacco, and vaping devices, such as e-cigarettes. If you  need help quitting, ask your health care provider. Do not use street drugs. Do not share needles. Ask your health care provider for help if you need support or information about quitting drugs. General instructions Schedule regular health, dental, and eye exams. Stay current with your vaccines. Tell your health care provider if: You often feel depressed. You have ever been abused or do not feel safe at home. Summary Adopting a healthy lifestyle and getting preventive care are important in promoting health and wellness. Follow your health care provider's instructions about healthy diet, exercising, and getting tested or screened for diseases. Follow your health care provider's instructions on monitoring your cholesterol and blood pressure. This information is not intended to replace advice given to you by your health care provider. Make sure you discuss any questions you have with your health care provider. Document Revised: 08/26/2020 Document Reviewed: 08/26/2020 Elsevier Patient Education  2024 [employer].

## 2023-12-01 NOTE — Progress Notes (Signed)
 New Patient Office Visit  Subjective    Patient ID: Hector Gray, male    DOB: 07-Dec-1995  Age: 28 y.o. MRN: [id number]  CC:  Chief Complaint  Patient presents with   Annual Exam    Physical.  No questions / concerns    Discussed the use of AI scribe software for clinical note transcription with the patient, who gave verbal consent to proceed.  History of Present Illness   Hector Gray is a 28 year old male who presents for a physical exam for insurance purposes.  He has elevated cholesterol levels, with a total cholesterol of 268 mg/dL and an LDL of 802 mg/dL. Two years ago, his LDL was 185 mg/dL. He has not initiated cholesterol medication despite recommendations. There is a family history of cardiovascular disease.  His A1c level has been stable at 5.7% for the past two years. He consumes a significant amount of water and Gatorade daily.   No current health concerns.      No outpatient encounter medications on file as of 12/01/2023.   No facility-administered encounter medications on file as of 12/01/2023.    History reviewed. No pertinent past medical history.  Past Surgical History:  Procedure Laterality Date   NO PAST SURGERIES      Family History  Problem Relation Age of Onset   Heart attack Father        x 2   Healthy Sister    Healthy Sister    Healthy Brother     Social History   Socioeconomic History   Marital status: Married    Spouse name: Public librarian   Number of children: 0   Years of education: Not on file   Highest education level: Not on file  Occupational History   Not on file  Tobacco Use   Smoking status: Never   Smokeless tobacco: Never  Vaping Use   Vaping status: Never Used  Substance and Sexual Activity   Alcohol use: Yes    Comment: beer once a week 6 beer   Drug use: No   Sexual activity: Not on file  Other Topics Concern   Not on file  Social History Narrative   Fulltime: cut trees      Hobbies: Architect and  fishing   Social Drivers of Corporate investment banker Strain: Not on file  Food Insecurity: Not on file  Transportation Needs: Not on file  Physical Activity: Not on file  Stress: Not on file  Social Connections: Not on file  Intimate Partner Violence: Not on file    Review of Systems  Constitutional: Negative.   HENT: Negative.    Eyes: Negative.   Respiratory:  Negative for shortness of breath.   Cardiovascular:  Negative for chest pain.  Gastrointestinal: Negative.   Genitourinary: Negative.   Musculoskeletal: Negative.   Skin: Negative.   Neurological: Negative.   Endo/Heme/Allergies: Negative.   Psychiatric/Behavioral: Negative.          Objective    BP 120/79 (BP Location: Left Arm, Patient Position: Sitting, Cuff Size: Large)   Pulse 63   Ht 6' 2 (1.88 m)   Wt 247 lb (112 kg)   SpO2 98%   BMI 31.71 kg/m   Physical Exam Vitals and nursing note reviewed.    GENERAL: Alert, cooperative, well developed, no acute distress. HEENT: Normocephalic, normal oropharynx, moist mucous membranes. NECK: Supple, no tenderness. CHEST: Clear to auscultation bilaterally, no wheezes, rhonchi, or crackles. CARDIOVASCULAR: Normal heart rate and  rhythm, S1 and S2 normal without murmurs. EXTREMITIES: No cyanosis or edema. NEUROLOGICAL: Cranial nerves grossly intact, moves all extremities without gross motor or sensory deficit.    Assessment & Plan:   Problem List Items Addressed This Visit       Other   Preventative health care   Relevant Orders   CBC with Differential/Platelet   Comp. Metabolic Panel (12)   Pure hypercholesterolemia   Relevant Orders   Lipid panel   Prediabetes - Primary   Relevant Orders   POCT glycosylated hemoglobin (Hb A1C) (Completed)   Assessment and Plan Adult Wellness Visit Patient education given on general health maintenance   Hypercholesterolemia Chronic condition with LDL at 197 mg/dL. Family history of cardiovascular  disease. LDL above target, increasing cardiovascular risk.  - Schedule fasting lipid panel to reassess cholesterol levels. - Consider initiation of low-dose statin therapy if LDL remains elevated.  Prediabetes A1c 5.3   Discussed importance of monitoring blood sugar levels and avoiding high-sugar beverages.   I have reviewed the patient's medical history (PMH, PSH, Social History, Family History, Medications, and allergies) , and have been updated if relevant. I spent 30 minutes reviewing chart and  face to face time with patient.   Return in about 6 days (around 12/07/2023) for Fasting  labs, With [facility].   Kirk RAMAN Mayers, PA-C

## 2023-12-02 ENCOUNTER — Encounter: Payer: Self-pay | Admitting: Physician Assistant

## 2023-12-06 ENCOUNTER — Telehealth: Payer: Self-pay | Admitting: Physician Assistant

## 2023-12-06 NOTE — Telephone Encounter (Signed)
 Appt confirmed

## 2023-12-07 ENCOUNTER — Ambulatory Visit

## 2023-12-07 DIAGNOSIS — Z0000 Encounter for general adult medical examination without abnormal findings: Secondary | ICD-10-CM | POA: Diagnosis not present

## 2023-12-07 DIAGNOSIS — E7800 Pure hypercholesterolemia, unspecified: Secondary | ICD-10-CM

## 2023-12-08 LAB — COMP. METABOLIC PANEL (12)
AST: 25 IU/L (ref 0–40)
Albumin: 4.7 g/dL (ref 4.3–5.2)
Alkaline Phosphatase: 59 IU/L (ref 44–121)
BUN/Creatinine Ratio: 16 (ref 9–20)
BUN: 16 mg/dL (ref 6–20)
Bilirubin Total: 0.6 mg/dL (ref 0.0–1.2)
Calcium: 9.7 mg/dL (ref 8.7–10.2)
Chloride: 102 mmol/L (ref 96–106)
Creatinine, Ser: 1.03 mg/dL (ref 0.76–1.27)
Globulin, Total: 2.6 g/dL (ref 1.5–4.5)
Glucose: 80 mg/dL (ref 70–99)
Potassium: 4.6 mmol/L (ref 3.5–5.2)
Sodium: 141 mmol/L (ref 134–144)
Total Protein: 7.3 g/dL (ref 6.0–8.5)
eGFR: 101 mL/min/1.73 (ref >59)

## 2023-12-08 LAB — CBC WITH DIFFERENTIAL/PLATELET
Basophils Absolute: 0.1 x10E3/uL (ref 0.0–0.2)
Basos: 1 %
EOS (ABSOLUTE): 0.2 x10E3/uL (ref 0.0–0.4)
Eos: 3 %
Hematocrit: 47.1 % (ref 37.5–51.0)
Hemoglobin: 15.1 g/dL (ref 13.0–17.7)
Immature Grans (Abs): 0 x10E3/uL (ref 0.0–0.1)
Immature Granulocytes: 0 %
Lymphocytes Absolute: 2 x10E3/uL (ref 0.7–3.1)
Lymphs: 30 %
MCH: 28.9 pg (ref 26.6–33.0)
MCHC: 32.1 g/dL (ref 31.5–35.7)
MCV: 90 fL (ref 79–97)
Monocytes Absolute: 0.5 x10E3/uL (ref 0.1–0.9)
Monocytes: 8 %
Neutrophils Absolute: 3.9 x10E3/uL (ref 1.4–7.0)
Neutrophils: 58 %
Platelets: 299 x10E3/uL (ref 150–450)
RBC: 5.22 x10E6/uL (ref 4.14–5.80)
RDW: 13 % (ref 11.6–15.4)
WBC: 6.7 x10E3/uL (ref 3.4–10.8)

## 2023-12-08 LAB — LIPID PANEL
Chol/HDL Ratio: 5.5 ratio — ABNORMAL HIGH (ref 0.0–5.0)
Cholesterol, Total: 244 mg/dL — ABNORMAL HIGH (ref 100–199)
HDL: 44 mg/dL (ref >39)
LDL Chol Calc (NIH): 182 mg/dL — ABNORMAL HIGH (ref 0–99)
Triglycerides: 102 mg/dL (ref 0–149)
VLDL Cholesterol Cal: 18 mg/dL (ref 5–40)

## 2023-12-09 ENCOUNTER — Ambulatory Visit: Payer: Self-pay | Admitting: Physician Assistant

## 2023-12-09 DIAGNOSIS — E7800 Pure hypercholesterolemia, unspecified: Secondary | ICD-10-CM

## 2023-12-09 MED ORDER — ATORVASTATIN CALCIUM 10 MG PO TABS: 10.0000 mg | ORAL_TABLET | Freq: Every day | ORAL | 2 refills | Status: DC

## 2024-02-14 ENCOUNTER — Ambulatory Visit: Payer: Self-pay

## 2024-02-14 NOTE — Telephone Encounter (Signed)
 Patient called to report mild pain to bilateral feet along with redness and discoloration to the bottom of feet. Patient states he noticed the discomfort on Wednesday night. Patient describes it as feeling like I have a cut on the bottom of both feet. Patient is scheduled to see PCP tomorrow at 9:20 AM.  FYI Only or Action Required?: FYI only for provider.  Patient was last seen in primary care on 12/01/2023 by Mayers, Kirk RAMAN, PA-C.  Called Nurse Triage reporting Foot Pain and discoloration to feet.  Symptoms began several days ago.  Interventions attempted: Rest, hydration, or home remedies.  Symptoms are: unchanged.  Triage Disposition: See Physician Within 24 Hours  Patient/caregiver understands and will follow disposition?: Yes  Copied from CRM [id number]. Topic: Clinical - Red Word Triage >> Feb 14, 2024  9:44 AM Victoria A wrote: [facility] that prompted transfer to Nurse Triage: Patient's wife called said patient's feet have brushing with marble color and has pain for over a week. Reason for Disposition  [1] Redness of the skin AND [2] no fever  Answer Assessment - Initial Assessment Questions 1. ONSET: When did the pain start?      Started Wednesday night 2. LOCATION: Where is the pain located?      Bilateral feet 3. PAIN: How bad is the pain?    (Scale 1-10; or mild, moderate, severe)     1 out of 10 4. WORK OR EXERCISE: Has there been any recent work or exercise that involved this part of the body?      Patient reports being on his feet at work.  5. CAUSE: What do you think is causing the foot pain?     unsure 6. OTHER SYMPTOMS: Do you have any other symptoms? (e.g., leg pain, rash, fever, numbness)     Patient reports discoloration to the bottoms of feet. Patient states it feels like I have a cut on the bottom of my feet.  Protocols used: Foot Pain-A-AH

## 2024-02-14 NOTE — Telephone Encounter (Signed)
 noted

## 2024-02-15 ENCOUNTER — Ambulatory Visit: Admitting: Nurse Practitioner

## 2024-02-15 VITALS — BP 122/78 | HR 68 | Temp 98.3°F | Ht 74.0 in | Wt 239.8 lb

## 2024-02-15 DIAGNOSIS — R21 Rash and other nonspecific skin eruption: Secondary | ICD-10-CM | POA: Diagnosis not present

## 2024-02-15 DIAGNOSIS — R202 Paresthesia of skin: Secondary | ICD-10-CM | POA: Insufficient documentation

## 2024-02-15 DIAGNOSIS — M79672 Pain in left foot: Secondary | ICD-10-CM | POA: Diagnosis not present

## 2024-02-15 DIAGNOSIS — M79671 Pain in right foot: Secondary | ICD-10-CM | POA: Insufficient documentation

## 2024-02-15 LAB — BASIC METABOLIC PANEL WITH GFR
BUN: 16 mg/dL (ref 6–23)
CO2: 28 meq/L (ref 19–32)
Calcium: 9.7 mg/dL (ref 8.4–10.5)
Chloride: 102 meq/L (ref 96–112)
Creatinine, Ser: 0.91 mg/dL (ref 0.40–1.50)
GFR: 113.64 mL/min (ref >60.00)
Glucose, Bld: 87 mg/dL (ref 70–99)
Potassium: 4.6 meq/L (ref 3.5–5.1)
Sodium: 138 meq/L (ref 135–145)

## 2024-02-15 LAB — CBC
HCT: 46.9 % (ref 39.0–52.0)
Hemoglobin: 15.8 g/dL (ref 13.0–17.0)
MCHC: 33.8 g/dL (ref 30.0–36.0)
MCV: 85.8 fl (ref 78.0–100.0)
Platelets: 296 K/uL (ref 150.0–400.0)
RBC: 5.47 Mil/uL (ref 4.22–5.81)
RDW: 12.9 % (ref 11.5–15.5)
WBC: 6.3 K/uL (ref 4.0–10.5)

## 2024-02-15 LAB — VITAMIN B12: Vitamin B-12: 355 pg/mL (ref 211–911)

## 2024-02-15 NOTE — Patient Instructions (Addendum)
 Nice to see you today  I will be in touch with the labs once I have them  Follow up with me in approx 10 months for your physical and labs  If it does not improve message me and I will send you to a podiatrist

## 2024-02-15 NOTE — Progress Notes (Signed)
 Established Patient Office Visit  Subjective   Patient ID: Hector Gray, male    DOB: 1995/10/16  Age: 28 y.o. MRN: [id number]  Chief Complaint  Patient presents with   discoloration of both feet    Pt complains of discolored (orange and purple) at bottoms of feet. Pt noticed on Wednesday. States of slight pain when scrunching feet.    Labs Only    HPI  Discussed the use of AI scribe software for clinical note transcription with the patient, who gave verbal consent to proceed.  History of Present Illness Hector Gray is a 28 year old male who presents with bilateral foot pain and tingling.  He began experiencing symptoms last Wednesday night, describing a knife-like pain across the pads of both feet, specifically on the top part of the feet. The pain was bilateral and located around the pads.  The following morning, he noticed a ring-like appearance around the pads of both feet. Despite this, he continued with his normal activities, including work, where he wears socks and lace-up boots. He recently started wearing a new pair of boots and socks since 2024/03/07.  He describes a tingling sensation when scrunching his feet, but notes that the initial pain has subsided. There is no itching associated with the symptoms. No recent illness, changes in soap or detergent, or exposure to wet conditions. No recent changes in relationship status that could suggest an STI.  The redness was more pronounced on 2024/03/07 night but has since decreased. The affected areas have not blistered or peeled. No fever or chills, and the feet do not feel cold. He reports that the area looks slightly darker at night after walking.  He recalls a recent wellness exam at a mobile clinic but does not mention any specific findings from that visit.    Review of Systems  Constitutional:  Negative for chills and fever.  Respiratory:  Negative for shortness of breath.   Cardiovascular:  Negative for chest pain.   Musculoskeletal:  Positive for joint pain.  Skin:  Positive for rash.  Neurological:  Positive for tingling. Negative for headaches.  Psychiatric/Behavioral:  Negative for hallucinations and suicidal ideas.       Objective:     BP 122/78   Pulse 68   Temp 98.3 F (36.8 C) (Oral)   Ht 6' 2 (1.88 m)   Wt 239 lb 12.8 oz (108.8 kg)   SpO2 98%   BMI 30.79 kg/m    Physical Exam Vitals and nursing note reviewed.  Constitutional:      Appearance: Normal appearance.  Cardiovascular:     Rate and Rhythm: Normal rate and regular rhythm.     Pulses:          Dorsalis pedis pulses are 2+ on the right side and 2+ on the left side.       Posterior tibial pulses are 1+ on the right side and 1+ on the left side.     Heart sounds: Normal heart sounds.  Pulmonary:     Effort: Pulmonary effort is normal.     Breath sounds: Normal breath sounds.  Musculoskeletal:       Feet:  Feet:     Comments: Macular area of erythema Neurological:     Mental Status: He is alert.      No results found for any visits on 02/15/24.    The ASCVD Risk score (Arnett DK, et al., 2019) failed to calculate for the following reasons:   The 2019  ASCVD risk score is only valid for ages 50 to 54    Assessment & Plan:   Problem List Items Addressed This Visit   None Visit Diagnoses       Foot pain, bilateral    -  Primary     Paresthesia       Relevant Orders   Vitamin B12   CBC   Basic metabolic panel with GFR     Rash       Relevant Orders   Vitamin B12   CBC   Basic metabolic panel with GFR      Assessment and Plan Assessment & Plan Bilateral plantar foot rash Rash with well-demarcated ring, improved redness, persistent. Differential: contact dermatitis, B12 deficiency, unlikely syphilis. - Order B12 blood test. - Refer to podiatrist if symptoms persist or worsen. - Advise monitoring rash for changes and report if no improvement.   Return in about 10 months (around 12/15/2024).     Hector Crandall, NP

## 2024-02-17 ENCOUNTER — Ambulatory Visit: Payer: Self-pay | Admitting: Nurse Practitioner

## 2024-11-30 ENCOUNTER — Encounter: Admitting: Nurse Practitioner

## 2024-12-15 ENCOUNTER — Encounter: Admitting: Nurse Practitioner
